# Patient Record
Sex: Female | Born: 1985 | Race: White | Hispanic: Yes | Marital: Single | Smoking: Never smoker
Health system: Southern US, Community
[De-identification: ages and names within clinical notes are randomized; demographics above are authoritative.]

## PROBLEM LIST (undated history)

## (undated) DIAGNOSIS — Z789 Other specified health status: Secondary | ICD-10-CM

## (undated) HISTORY — PX: NO PAST SURGERIES: SHX2092

---

## 2009-06-05 ENCOUNTER — Emergency Department (HOSPITAL_COMMUNITY): Admission: EM | Admit: 2009-06-05 | Discharge: 2009-06-05 | Payer: Self-pay | Admitting: Emergency Medicine

## 2009-06-09 ENCOUNTER — Inpatient Hospital Stay (HOSPITAL_COMMUNITY): Admission: AD | Admit: 2009-06-09 | Discharge: 2009-06-09 | Payer: Self-pay | Admitting: Obstetrics and Gynecology

## 2009-09-05 ENCOUNTER — Ambulatory Visit (HOSPITAL_COMMUNITY): Admission: RE | Admit: 2009-09-05 | Discharge: 2009-09-05 | Payer: Self-pay | Admitting: Family Medicine

## 2009-09-25 ENCOUNTER — Ambulatory Visit (HOSPITAL_COMMUNITY): Admission: RE | Admit: 2009-09-25 | Discharge: 2009-09-25 | Payer: Self-pay | Admitting: Obstetrics & Gynecology

## 2009-11-06 ENCOUNTER — Ambulatory Visit (HOSPITAL_COMMUNITY): Admission: RE | Admit: 2009-11-06 | Discharge: 2009-11-06 | Payer: Self-pay | Admitting: Obstetrics & Gynecology

## 2009-12-11 ENCOUNTER — Ambulatory Visit (HOSPITAL_COMMUNITY): Admission: RE | Admit: 2009-12-11 | Discharge: 2009-12-11 | Payer: Self-pay | Admitting: Obstetrics & Gynecology

## 2010-01-14 ENCOUNTER — Inpatient Hospital Stay (HOSPITAL_COMMUNITY): Admission: AD | Admit: 2010-01-14 | Discharge: 2010-01-14 | Payer: Self-pay | Admitting: Obstetrics & Gynecology

## 2010-01-19 ENCOUNTER — Inpatient Hospital Stay (HOSPITAL_COMMUNITY): Admission: AD | Admit: 2010-01-19 | Discharge: 2010-01-21 | Payer: Self-pay | Admitting: Obstetrics & Gynecology

## 2010-01-19 ENCOUNTER — Ambulatory Visit: Payer: Self-pay | Admitting: Obstetrics and Gynecology

## 2010-01-19 ENCOUNTER — Inpatient Hospital Stay (HOSPITAL_COMMUNITY): Admission: AD | Admit: 2010-01-19 | Discharge: 2010-01-19 | Payer: Self-pay | Admitting: Obstetrics and Gynecology

## 2010-01-23 ENCOUNTER — Inpatient Hospital Stay (HOSPITAL_COMMUNITY)
Admission: AD | Admit: 2010-01-23 | Discharge: 2010-01-26 | Payer: Self-pay | Source: Home / Self Care | Admitting: Obstetrics and Gynecology

## 2010-03-05 ENCOUNTER — Encounter: Admission: RE | Admit: 2010-03-05 | Discharge: 2010-03-05 | Payer: Self-pay | Admitting: Family Medicine

## 2010-08-14 LAB — DIFFERENTIAL
Basophils Absolute: 0 10*3/uL (ref 0.0–0.1)
Basophils Relative: 0 % (ref 0–1)
Basophils Relative: 0 % (ref 0–1)
Eosinophils Absolute: 0.1 10*3/uL (ref 0.0–0.7)
Eosinophils Absolute: 0.1 10*3/uL (ref 0.0–0.7)
Eosinophils Relative: 1 % (ref 0–5)
Lymphs Abs: 1.9 10*3/uL (ref 0.7–4.0)
Monocytes Absolute: 0.5 10*3/uL (ref 0.1–1.0)
Monocytes Relative: 5 % (ref 3–12)
Neutro Abs: 8 10*3/uL — ABNORMAL HIGH (ref 1.7–7.7)
Neutrophils Relative %: 79 % — ABNORMAL HIGH (ref 43–77)

## 2010-08-14 LAB — LIPASE, BLOOD: Lipase: 22 U/L (ref 11–59)

## 2010-08-14 LAB — CBC
HCT: 31.6 % — ABNORMAL LOW (ref 36.0–46.0)
HCT: 32.6 % — ABNORMAL LOW (ref 36.0–46.0)
Hemoglobin: 10.4 g/dL — ABNORMAL LOW (ref 12.0–15.0)
Hemoglobin: 10.6 g/dL — ABNORMAL LOW (ref 12.0–15.0)
Hemoglobin: 10.9 g/dL — ABNORMAL LOW (ref 12.0–15.0)
MCH: 26.7 pg (ref 26.0–34.0)
MCH: 26.9 pg (ref 26.0–34.0)
MCHC: 32.3 g/dL (ref 30.0–36.0)
MCHC: 32.9 g/dL (ref 30.0–36.0)
MCV: 82.4 fL (ref 78.0–100.0)
MCV: 82.6 fL (ref 78.0–100.0)
Platelets: 167 10*3/uL (ref 150–400)
RBC: 3.84 MIL/uL — ABNORMAL LOW (ref 3.87–5.11)
RDW: 16.2 % — ABNORMAL HIGH (ref 11.5–15.5)
RDW: 16.8 % — ABNORMAL HIGH (ref 11.5–15.5)
WBC: 10.9 10*3/uL — ABNORMAL HIGH (ref 4.0–10.5)
WBC: 9.6 10*3/uL (ref 4.0–10.5)

## 2010-08-14 LAB — MRSA CULTURE

## 2010-08-14 LAB — COMPREHENSIVE METABOLIC PANEL
ALT: 27 U/L (ref 0–35)
AST: 23 U/L (ref 0–37)
Alkaline Phosphatase: 96 U/L (ref 39–117)
CO2: 25 mEq/L (ref 19–32)
CO2: 26 mEq/L (ref 19–32)
Calcium: 8.1 mg/dL — ABNORMAL LOW (ref 8.4–10.5)
Calcium: 8.8 mg/dL (ref 8.4–10.5)
Chloride: 106 mEq/L (ref 96–112)
Creatinine, Ser: 0.48 mg/dL (ref 0.4–1.2)
GFR calc Af Amer: >60 mL/min (ref >60)
GFR calc Af Amer: >60 mL/min (ref >60)
GFR calc non Af Amer: >60 mL/min (ref >60)
GFR calc non Af Amer: >60 mL/min (ref >60)
Glucose, Bld: 97 mg/dL (ref 70–99)
Glucose, Bld: 98 mg/dL (ref 70–99)
Potassium: 3.2 mEq/L — ABNORMAL LOW (ref 3.5–5.1)
Sodium: 138 mEq/L (ref 135–145)
Sodium: 139 mEq/L (ref 135–145)
Total Bilirubin: 0.6 mg/dL (ref 0.3–1.2)
Total Protein: 6.1 g/dL (ref 6.0–8.3)

## 2010-08-14 LAB — URINALYSIS, DIPSTICK ONLY
Bilirubin Urine: NEGATIVE
Specific Gravity, Urine: 1.015 (ref 1.005–1.030)
Urobilinogen, UA: 0.2 mg/dL (ref 0.0–1.0)

## 2010-08-14 LAB — URINE CULTURE
Colony Count: 25000
Culture  Setup Time: 201108271809

## 2010-08-14 LAB — RPR: RPR Ser Ql: NONREACTIVE

## 2010-08-16 LAB — COMPREHENSIVE METABOLIC PANEL
Albumin: 3.4 g/dL — ABNORMAL LOW (ref 3.5–5.2)
BUN: 6 mg/dL (ref 6–23)
Chloride: 100 mEq/L (ref 96–112)
Creatinine, Ser: 0.41 mg/dL (ref 0.4–1.2)
GFR calc non Af Amer: >60 mL/min (ref >60)
Glucose, Bld: 77 mg/dL (ref 70–99)
Total Bilirubin: 0.2 mg/dL — ABNORMAL LOW (ref 0.3–1.2)

## 2010-08-16 LAB — URINALYSIS, ROUTINE W REFLEX MICROSCOPIC
Glucose, UA: 100 mg/dL — AB
Glucose, UA: NEGATIVE mg/dL
Hgb urine dipstick: NEGATIVE
Hgb urine dipstick: NEGATIVE
Ketones, ur: NEGATIVE mg/dL
Leukocytes, UA: NEGATIVE
Protein, ur: NEGATIVE mg/dL
Protein, ur: NEGATIVE mg/dL
Specific Gravity, Urine: 1.025 (ref 1.005–1.030)
pH: 7.5 (ref 5.0–8.0)

## 2010-08-16 LAB — CBC
HCT: 37.3 % (ref 36.0–46.0)
MCV: 84.3 fL (ref 78.0–100.0)
Platelets: 213 10*3/uL (ref 150–400)
RDW: 14.6 % (ref 11.5–15.5)
WBC: 12.4 10*3/uL — ABNORMAL HIGH (ref 4.0–10.5)

## 2010-08-16 LAB — URINE MICROSCOPIC-ADD ON

## 2010-08-16 LAB — POCT PREGNANCY, URINE: Preg Test, Ur: POSITIVE

## 2011-02-25 IMAGING — US US PELVIS COMPLETE
1 series · 14 of 25 positions shown · non-contrast
Comparison: None.

CLINICAL DATA: Pelvic pain.

OBSTETRIC <14 WK US AND TRANSVAGINAL OB US
TECHNIQUE: Both transabdominal and transvaginal ultrasound
examinations were performed for complete evaluation of the
gestation as well as the maternal uterus, adnexal regions, and
pelvic cul-de-sac.

[Series 1: us pelvis complete · 0.23mm/px · 14 of 63 slices shown]
[im 1/63]
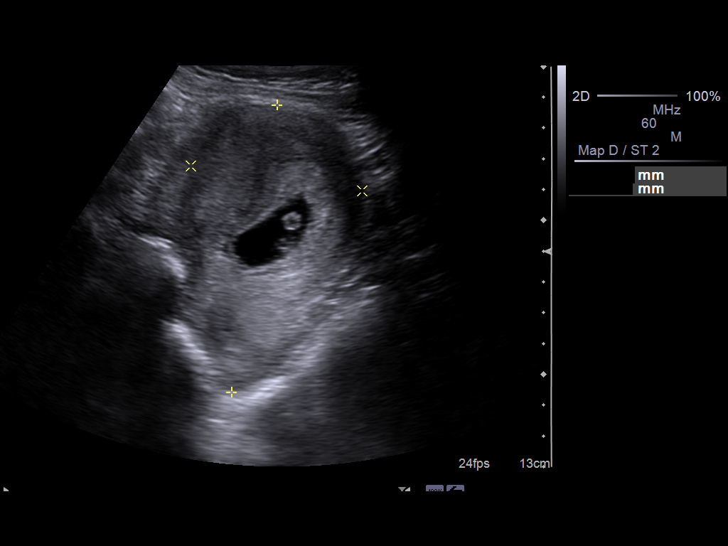
[im 6/63]
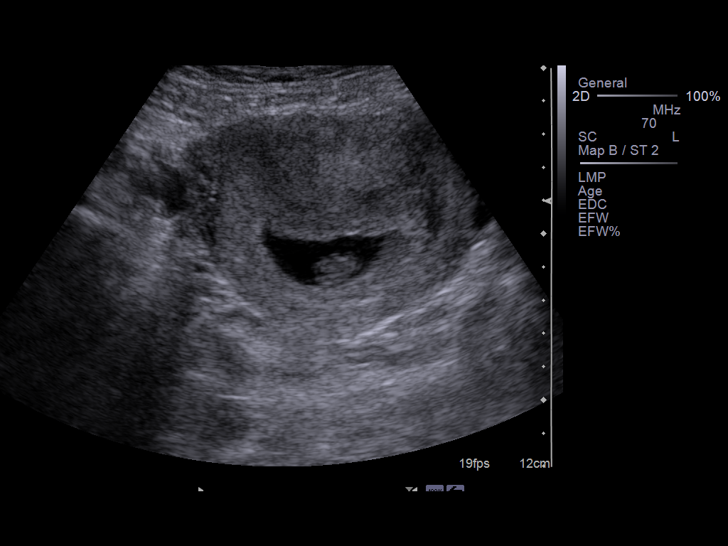
[im 11/63]
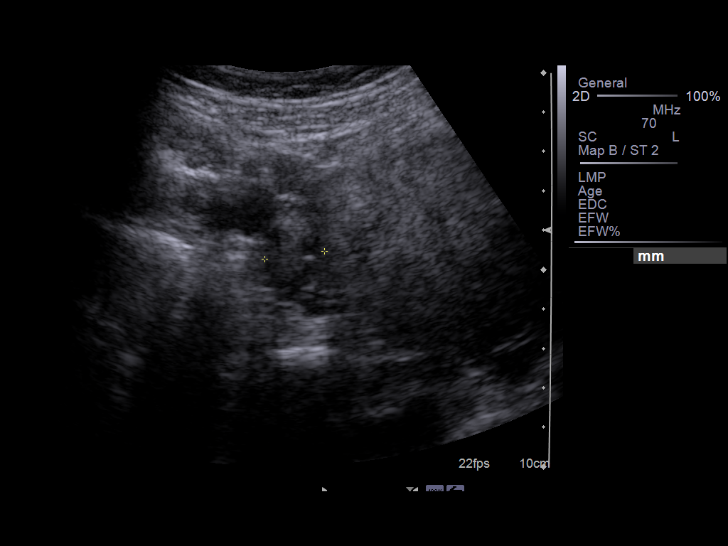
[im 16/63]
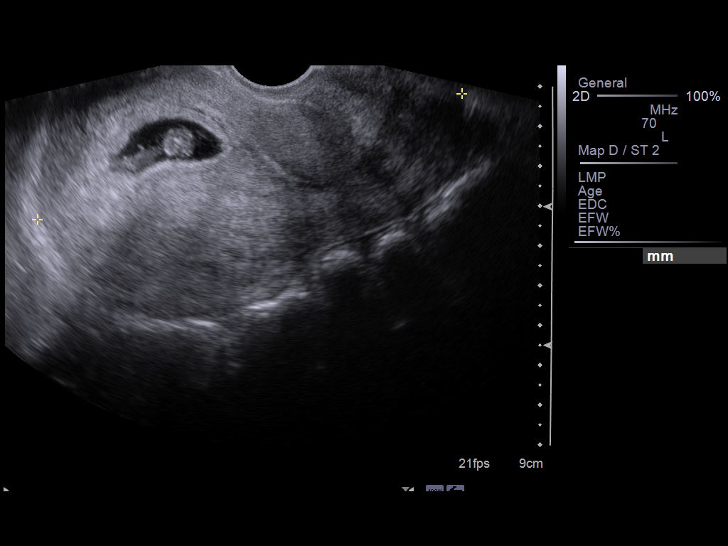
[im 21/63]
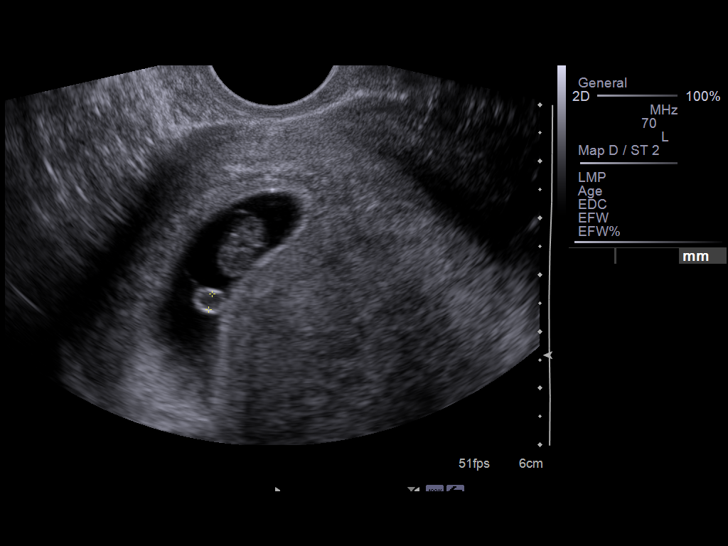
[im 24/63]
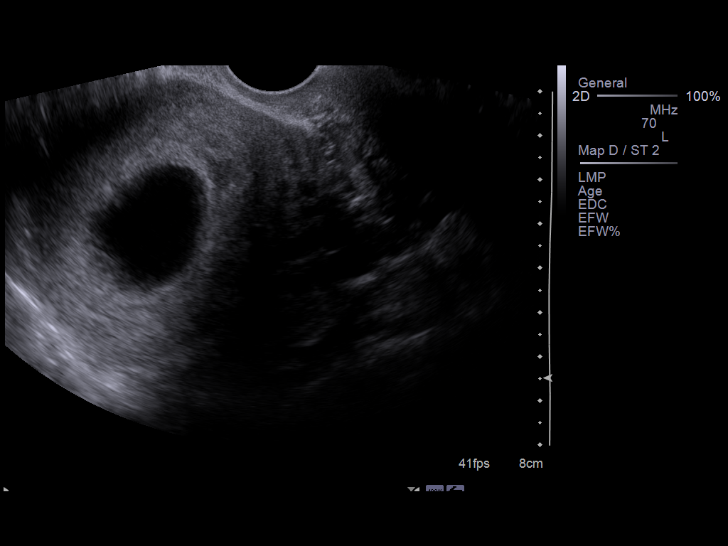
[im 29/63]
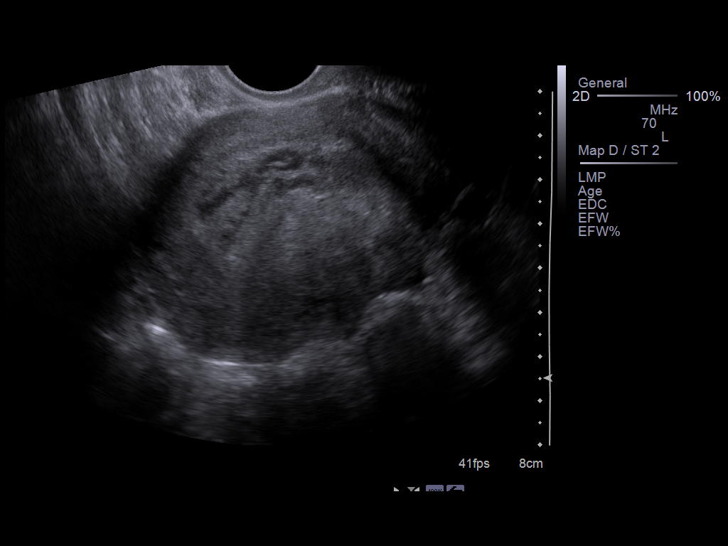
[im 34/63]
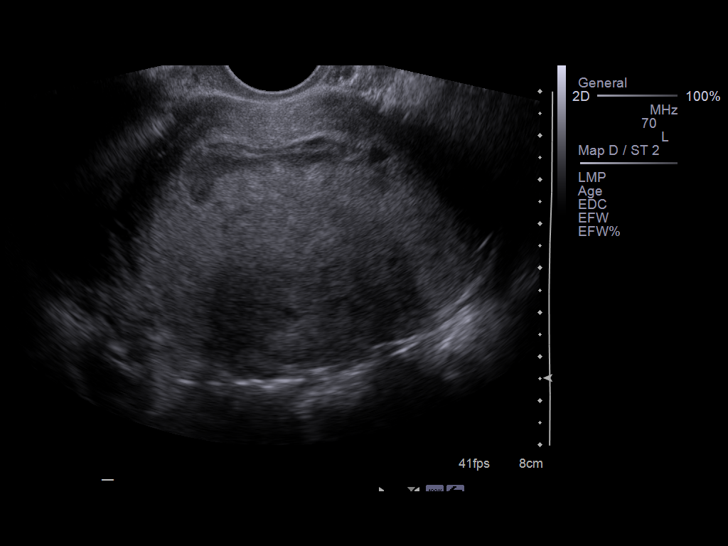
[im 39/63]
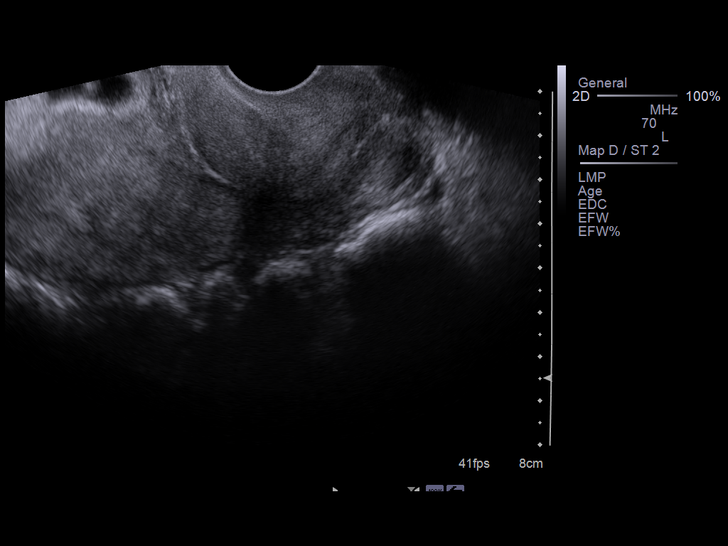
[im 42/63]
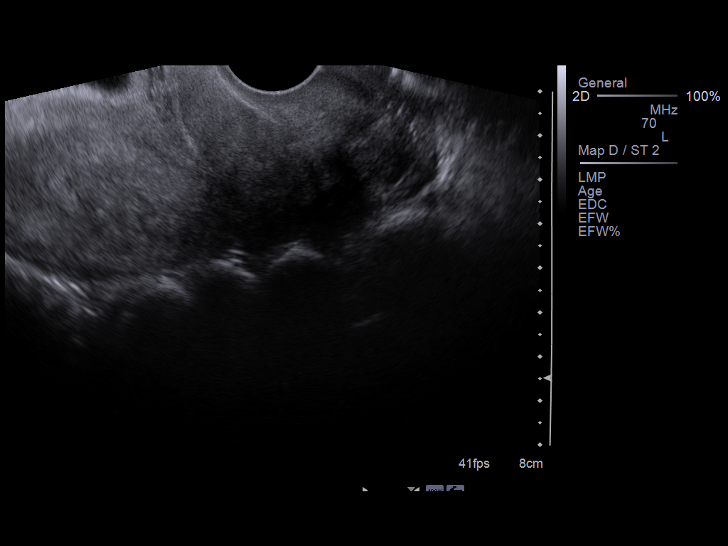
[im 47/63]
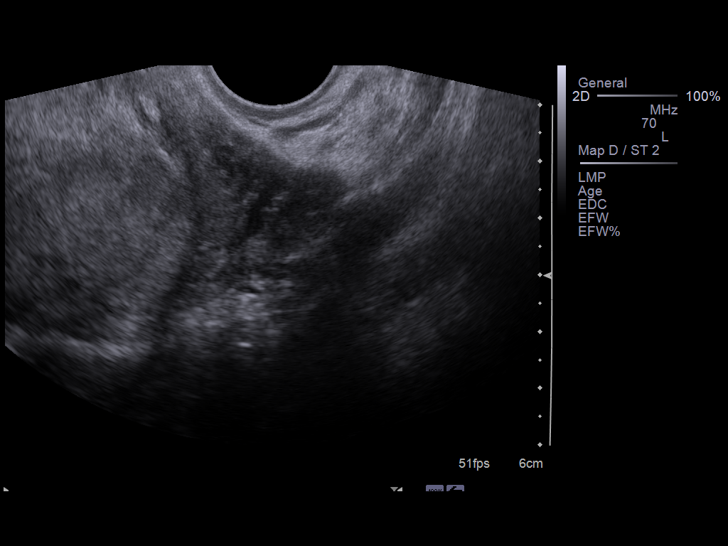
[im 52/63]
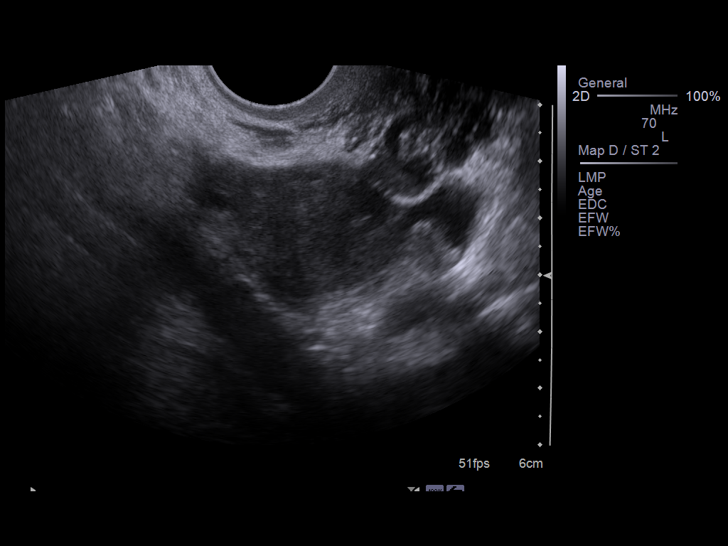
[im 57/63]
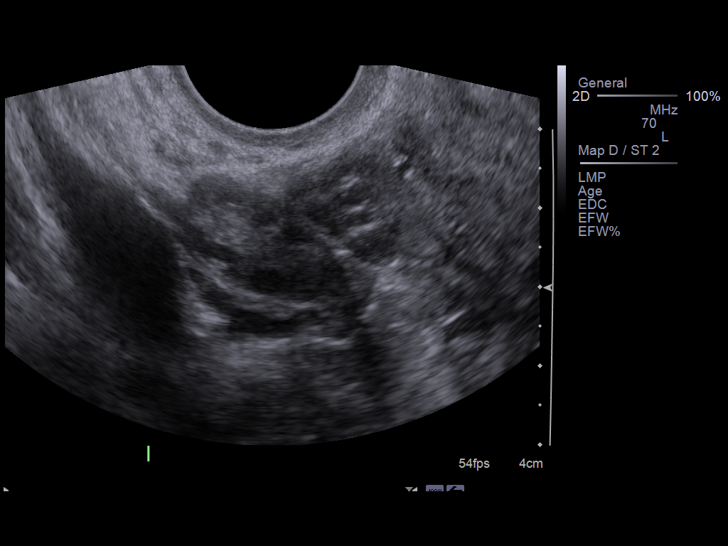
[im 63/63]
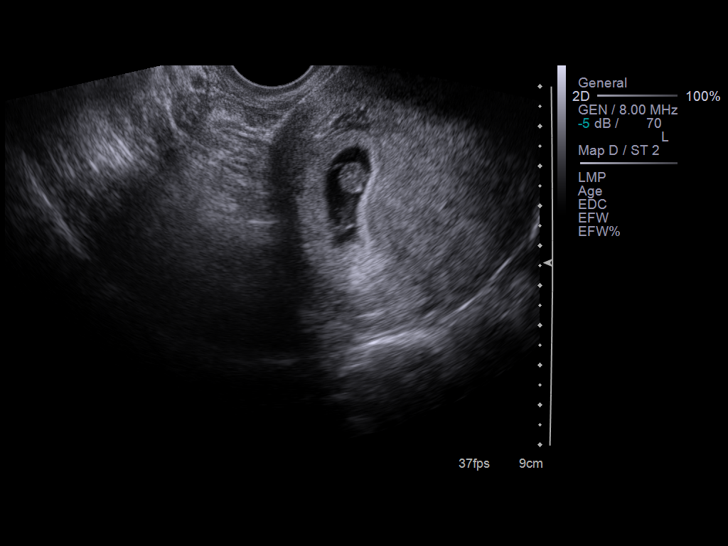

[14 of 25 positions shown; findings below may reference images not displayed]

Intrauterine gestational sac: Single
Yolk sac: Yes
Embryo: Yes
Cardiac Activity: Yes
Heart Rate: 160 bpm

MSD: 35  mm  8    w 6    d
CRL: 11.8 mm           7   w  2   d             US EDC: 01/15/2010

Maternal uterus/adnexae:
There is a small subchorionic hemorrhage.

Both ovaries are normal with evidence of blood flow to both
ovaries.

There is no free fluid in the pelvis.
IMPRESSION: Small subchorionic hemorrhage.

## 2011-07-29 IMAGING — US US OB FOLLOW-UP
1 series · 18 of 28 positions shown · non-contrast
Comparison: none

OBSTETRICAL ULTRASOUND:
 This ultrasound was performed in The [HOSPITAL], and the [facility] report will be stored to [REDACTED] PACS.  This report is also available in [HOSPITAL]?s accessANYware.

[Series 1: us ob follow-up · 18 of 63 slices shown]
[im 1/63]
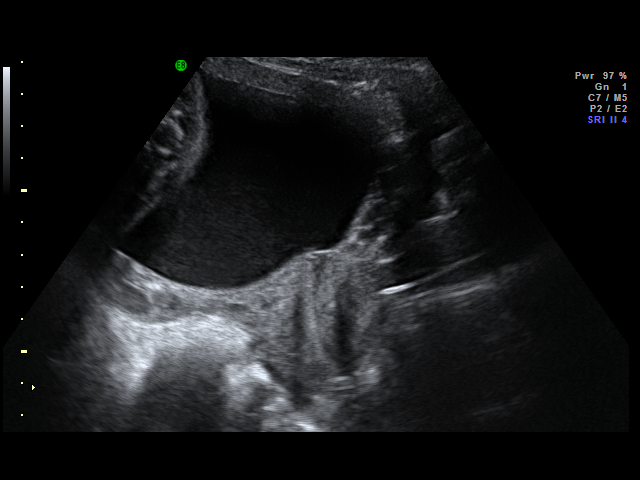
[im 5/63]
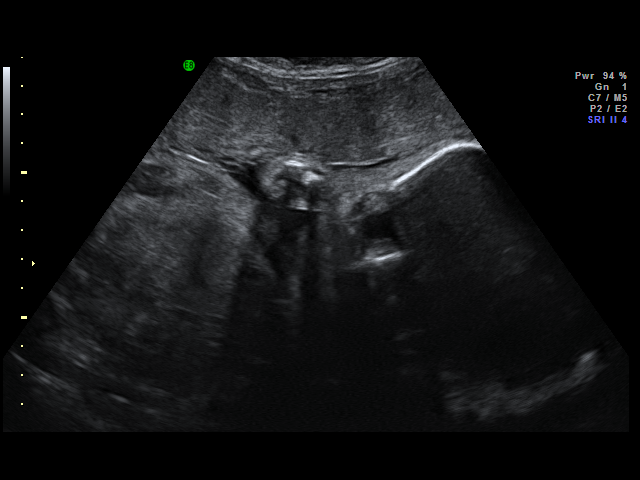
[im 7/63]
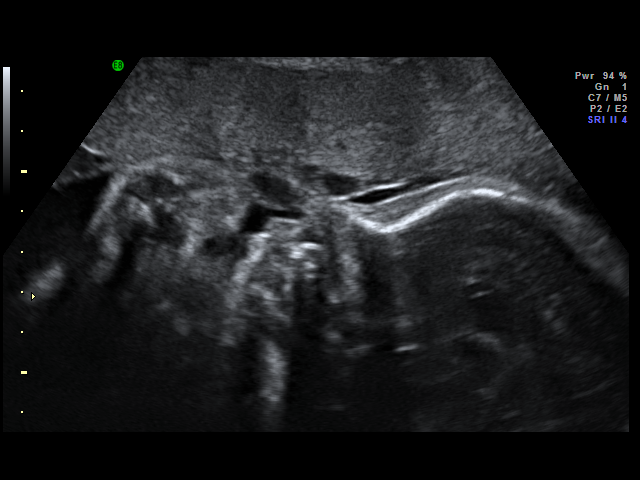
[im 12/63]
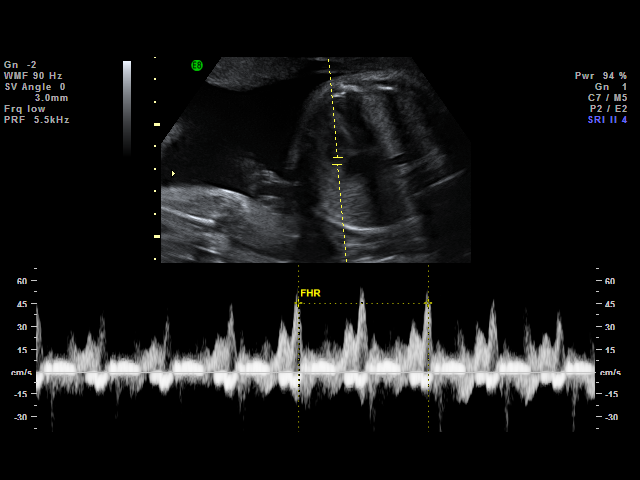
[im 17/63]
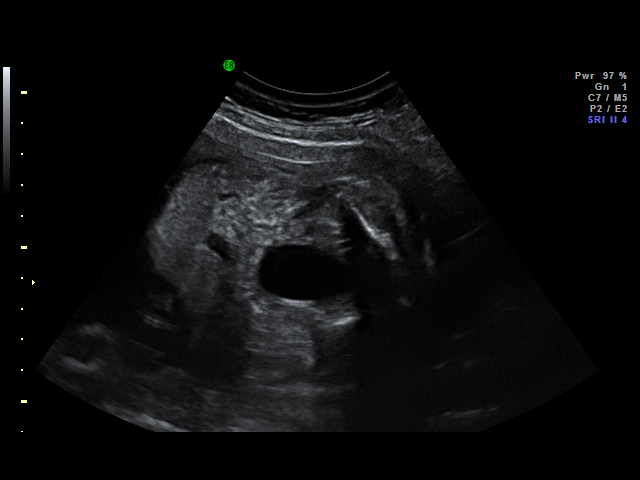
[im 19/63]
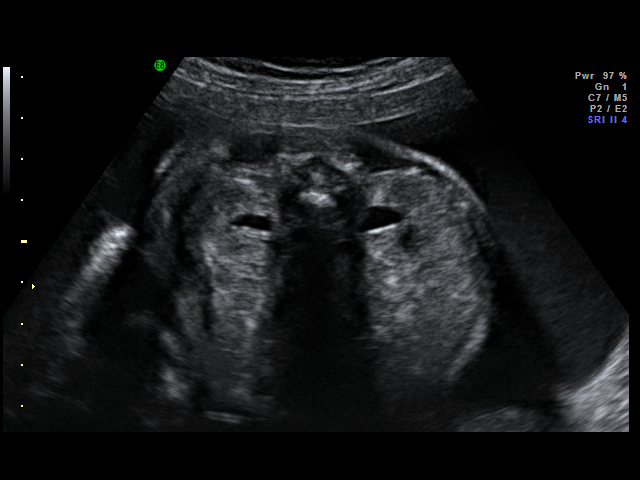
[im 23/63]
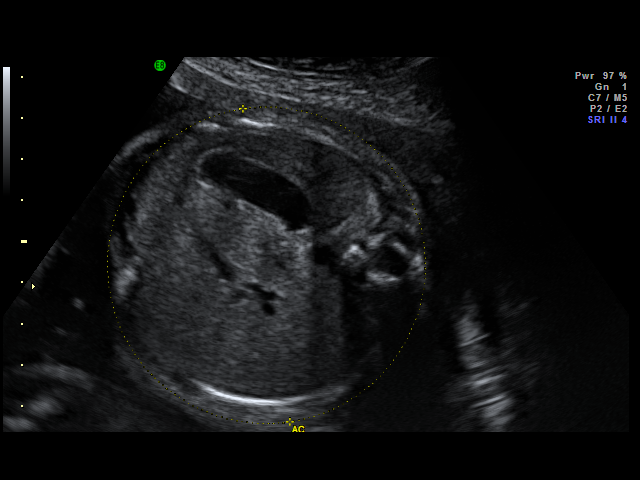
[im 26/63]
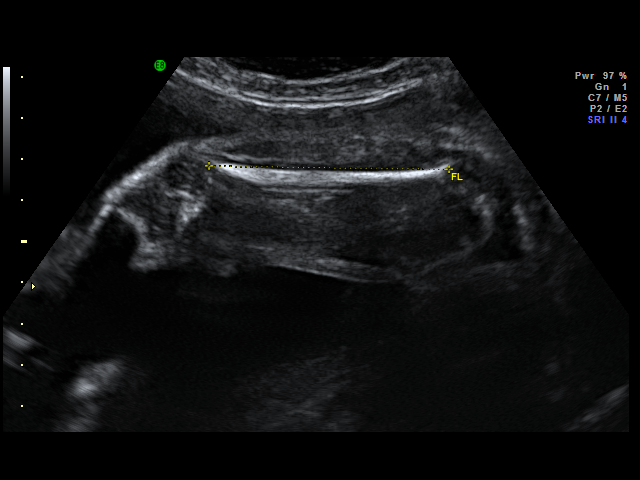
[im 30/63]
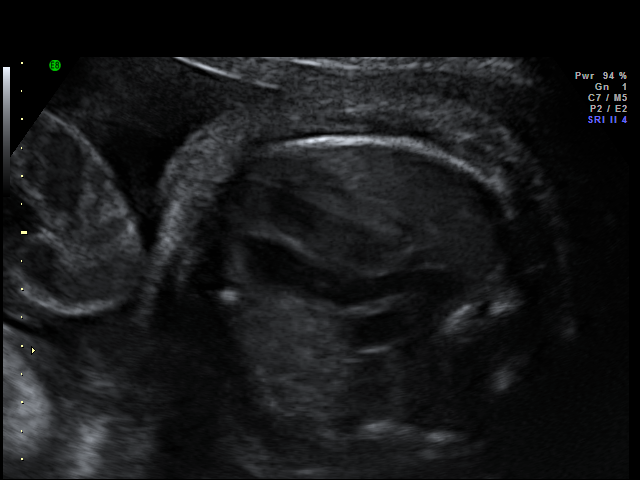
[im 33/63]
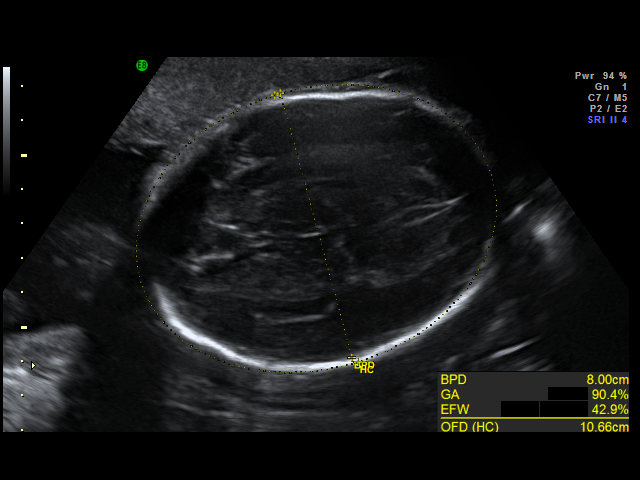
[im 37/63]
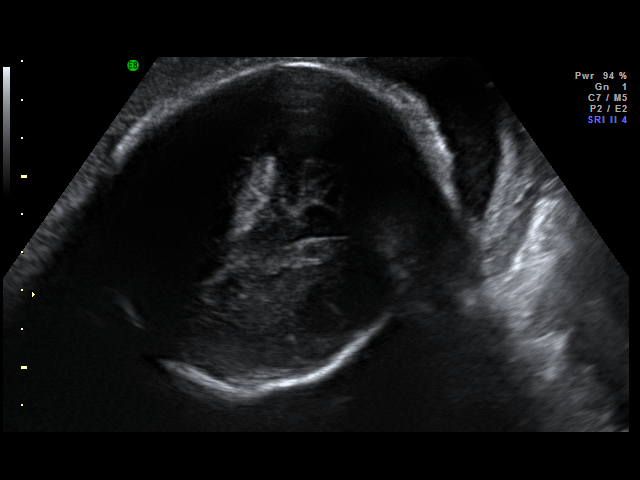
[im 40/63]
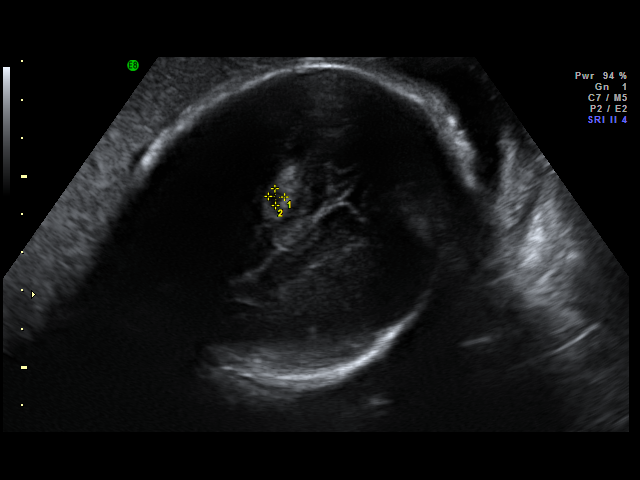
[im 44/63]
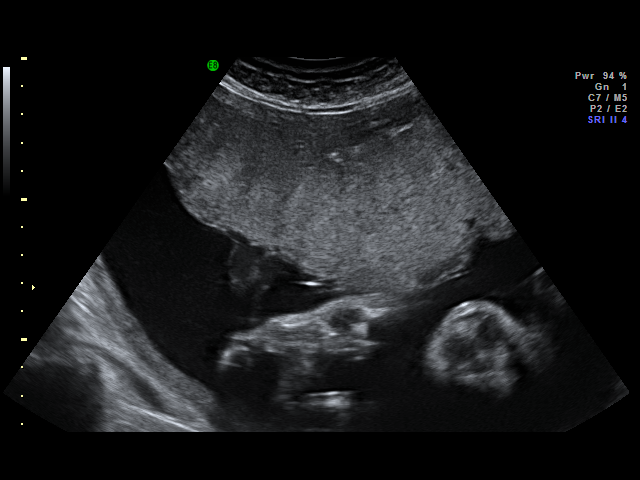
[im 49/63]
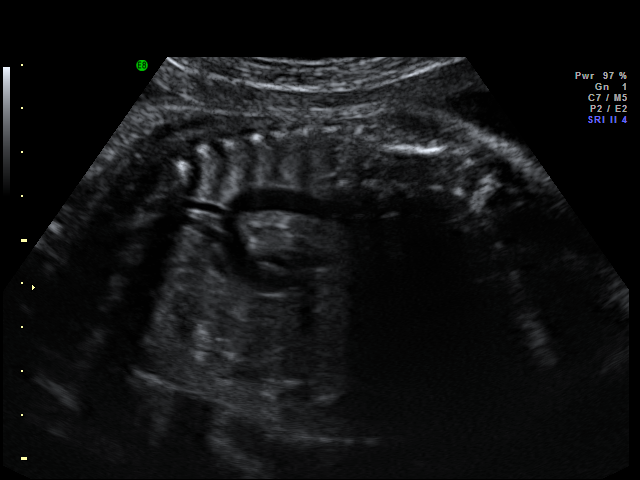
[im 51/63]
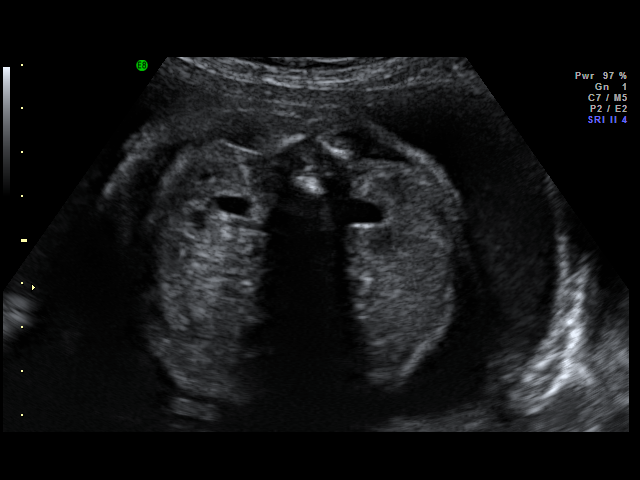
[im 56/63]
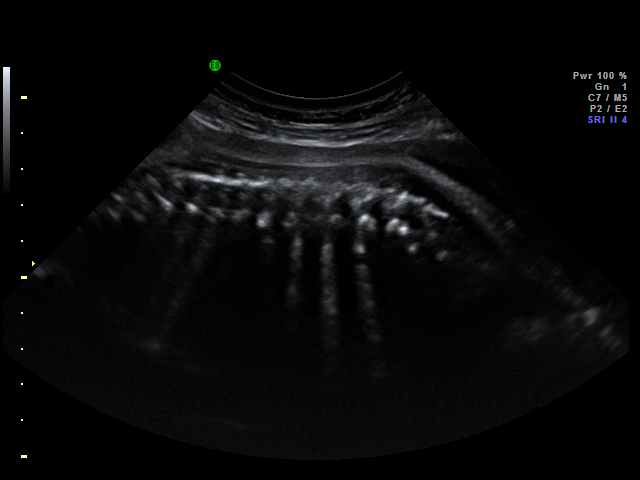
[im 58/63]
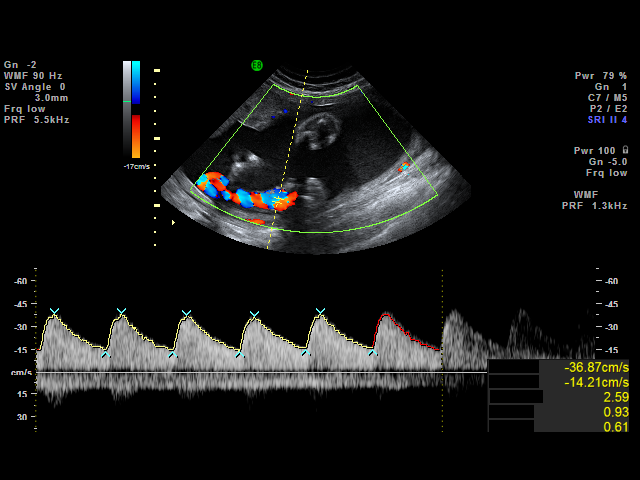
[im 63/63]
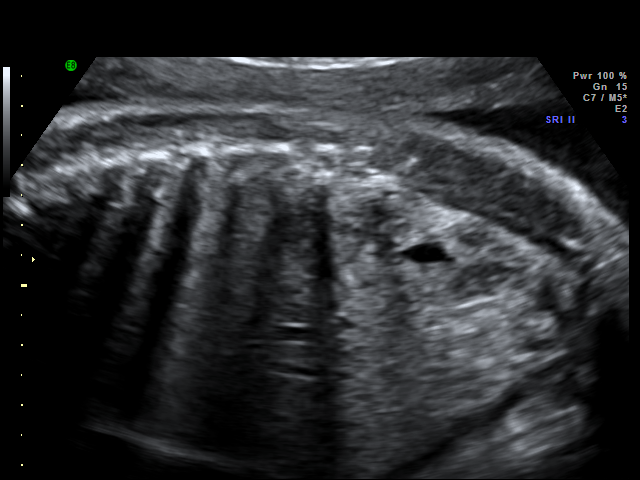

[18 of 28 positions shown; findings below may reference images not displayed]

IMPRESSION: [facility] has also been faxed to the ordering physician.

## 2017-05-31 NOTE — L&D Delivery Note (Signed)
OB/GYN Faculty Practice Delivery Note  Heather Gentry is a 32 y.o. G2P1001 s/p NSVD at 3169w4d. She was admitted for labor.   ROM: 4h 622m with bloody fluid GBS Status: neg Maximum Maternal Temperature: 99.5  Labor Progress: . Presented in early labor with some vaginal bleeding, progressed without augmentation.  Delivery Date/Time: 04/14/18 at 1453 Delivery: Called to room and patient was complete and pushing. Head delivered LOA. Cord wrapped around ankles Shoulder and body delivered in usual fashion. Infant with spontaneous cry, placed on mother's abdomen, dried and stimulated. Cord clamped x 2 after 1-minute delay, and cut by dad. Cord blood drawn. Placenta delivered spontaneously with gentle cord traction. Fundus firm with massage and Pitocin. Labia, perineum, vagina, and cervix inspected with bilateral periurethral tears, right hemostatic, left repaired with one 3-0 vicryl suture. Mild abrasion at perineum, hemostatic.  Placenta: intact Complications: none Lacerations: bilateral periurethral, repaired left EBL: 309 ml  Postpartum Planning [/] message to sent to schedule follow-up  [x]  vaccines UTD  Recommend f/u HBsAg testing and referral to hepatology pending results  Infant: Heather Gentry  APGARs 9/9  weight pending  Diego Delancey L. Zachery ConchFriedman, MD OB/GYN Fellow, Faculty Practice

## 2017-10-03 LAB — OB RESULTS CONSOLE RUBELLA ANTIBODY, IGM: Rubella: IMMUNE

## 2017-10-03 LAB — OB RESULTS CONSOLE HEPATITIS B SURFACE ANTIGEN: Hepatitis B Surface Ag: POSITIVE

## 2017-10-03 LAB — OB RESULTS CONSOLE HIV ANTIBODY (ROUTINE TESTING): HIV: NONREACTIVE

## 2017-10-03 LAB — OB RESULTS CONSOLE ANTIBODY SCREEN: Antibody Screen: NEGATIVE

## 2017-10-03 LAB — OB RESULTS CONSOLE GC/CHLAMYDIA
CHLAMYDIA, DNA PROBE: NEGATIVE
Gonorrhea: NEGATIVE

## 2017-10-03 LAB — OB RESULTS CONSOLE RPR: RPR: NONREACTIVE

## 2017-10-03 LAB — OB RESULTS CONSOLE ABO/RH: RH TYPE: POSITIVE

## 2017-10-04 ENCOUNTER — Other Ambulatory Visit (HOSPITAL_COMMUNITY): Payer: Self-pay | Admitting: Family

## 2017-10-04 DIAGNOSIS — Z369 Encounter for antenatal screening, unspecified: Secondary | ICD-10-CM

## 2017-10-04 DIAGNOSIS — Z3A13 13 weeks gestation of pregnancy: Secondary | ICD-10-CM

## 2017-10-12 ENCOUNTER — Ambulatory Visit (HOSPITAL_COMMUNITY): Payer: Self-pay

## 2017-10-12 ENCOUNTER — Encounter (HOSPITAL_COMMUNITY): Payer: Self-pay

## 2018-03-20 LAB — OB RESULTS CONSOLE GBS: STREP GROUP B AG: NEGATIVE

## 2018-03-20 LAB — OB RESULTS CONSOLE GC/CHLAMYDIA
Chlamydia: NEGATIVE
Gonorrhea: NEGATIVE

## 2018-04-12 ENCOUNTER — Encounter (HOSPITAL_COMMUNITY): Payer: Self-pay | Admitting: *Deleted

## 2018-04-12 ENCOUNTER — Telehealth (HOSPITAL_COMMUNITY): Payer: Self-pay | Admitting: *Deleted

## 2018-04-12 NOTE — Telephone Encounter (Signed)
Preadmission screen  

## 2018-04-14 ENCOUNTER — Other Ambulatory Visit: Payer: Self-pay

## 2018-04-14 ENCOUNTER — Inpatient Hospital Stay (HOSPITAL_COMMUNITY): Payer: Medicaid Other | Admitting: Anesthesiology

## 2018-04-14 ENCOUNTER — Inpatient Hospital Stay (HOSPITAL_COMMUNITY)
Admission: AD | Admit: 2018-04-14 | Discharge: 2018-04-16 | DRG: 806 | Disposition: A | Discharge status: Home / Self Care | Payer: Medicaid Other | Attending: Obstetrics & Gynecology | Admitting: Obstetrics & Gynecology

## 2018-04-14 ENCOUNTER — Encounter (HOSPITAL_COMMUNITY): Payer: Self-pay | Admitting: *Deleted

## 2018-04-14 DIAGNOSIS — B1910 Unspecified viral hepatitis B without hepatic coma: Secondary | ICD-10-CM | POA: Diagnosis present

## 2018-04-14 DIAGNOSIS — Z3A40 40 weeks gestation of pregnancy: Secondary | ICD-10-CM

## 2018-04-14 DIAGNOSIS — O9842 Viral hepatitis complicating childbirth: Secondary | ICD-10-CM | POA: Diagnosis present

## 2018-04-14 DIAGNOSIS — O9902 Anemia complicating childbirth: Secondary | ICD-10-CM | POA: Diagnosis present

## 2018-04-14 DIAGNOSIS — Z3483 Encounter for supervision of other normal pregnancy, third trimester: Secondary | ICD-10-CM | POA: Diagnosis present

## 2018-04-14 DIAGNOSIS — D649 Anemia, unspecified: Secondary | ICD-10-CM | POA: Diagnosis present

## 2018-04-14 DIAGNOSIS — O98419 Viral hepatitis complicating pregnancy, unspecified trimester: Secondary | ICD-10-CM | POA: Diagnosis present

## 2018-04-14 DIAGNOSIS — O480 Post-term pregnancy: Secondary | ICD-10-CM | POA: Diagnosis not present

## 2018-04-14 HISTORY — DX: Other specified health status: Z78.9

## 2018-04-14 LAB — CBC
HEMATOCRIT: 37.7 % (ref 36.0–46.0)
HEMOGLOBIN: 12.4 g/dL (ref 12.0–15.0)
MCH: 29.1 pg (ref 26.0–34.0)
MCHC: 32.9 g/dL (ref 30.0–36.0)
MCV: 88.5 fL (ref 80.0–100.0)
NRBC: 0 % (ref 0.0–0.2)
Platelets: 163 10*3/uL (ref 150–400)
RBC: 4.26 MIL/uL (ref 3.87–5.11)
RDW: 14.2 % (ref 11.5–15.5)
WBC: 17.1 10*3/uL — ABNORMAL HIGH (ref 4.0–10.5)

## 2018-04-14 LAB — TYPE AND SCREEN
ABO/RH(D): O POS
Antibody Screen: NEGATIVE

## 2018-04-14 LAB — HEPATIC FUNCTION PANEL
ALK PHOS: 143 U/L — AB (ref 38–126)
ALT: 25 U/L (ref 0–44)
AST: 22 U/L (ref 15–41)
Albumin: 3.2 g/dL — ABNORMAL LOW (ref 3.5–5.0)
BILIRUBIN INDIRECT: 0.5 mg/dL (ref 0.3–0.9)
Bilirubin, Direct: 0.1 mg/dL (ref 0.0–0.2)
Total Bilirubin: 0.6 mg/dL (ref 0.3–1.2)
Total Protein: 6.5 g/dL (ref 6.5–8.1)

## 2018-04-14 LAB — ABO/RH: ABO/RH(D): O POS

## 2018-04-14 MED ORDER — OXYTOCIN 40 UNITS IN LACTATED RINGERS INFUSION - SIMPLE MED
2.5000 [IU]/h | INTRAVENOUS | Status: DC
  Filled 2018-04-14: qty 1000

## 2018-04-14 MED ORDER — DIPHENHYDRAMINE HCL 25 MG PO CAPS: 25.0000 mg | ORAL_CAPSULE | Freq: Four times a day (QID) | ORAL | Status: DC | PRN

## 2018-04-14 MED ORDER — LACTATED RINGERS IV SOLN: 500.0000 mL | Freq: Once | INTRAVENOUS | Status: DC

## 2018-04-14 MED ORDER — ACETAMINOPHEN 325 MG PO TABS: 650.0000 mg | ORAL_TABLET | ORAL | Status: DC | PRN

## 2018-04-14 MED ORDER — ZOLPIDEM TARTRATE 5 MG PO TABS: 5.0000 mg | ORAL_TABLET | Freq: Every evening | ORAL | Status: DC | PRN

## 2018-04-14 MED ORDER — WITCH HAZEL-GLYCERIN EX PADS: 1.0000 "application " | MEDICATED_PAD | CUTANEOUS | Status: DC | PRN

## 2018-04-14 MED ORDER — ONDANSETRON HCL 4 MG PO TABS: 4.0000 mg | ORAL_TABLET | ORAL | Status: DC | PRN

## 2018-04-14 MED ORDER — ONDANSETRON HCL 4 MG/2ML IJ SOLN: 4.0000 mg | INTRAMUSCULAR | Status: DC | PRN

## 2018-04-14 MED ORDER — FLEET ENEMA 7-19 GM/118ML RE ENEM: 1.0000 | ENEMA | RECTAL | Status: DC | PRN

## 2018-04-14 MED ORDER — SENNOSIDES-DOCUSATE SODIUM 8.6-50 MG PO TABS
2.0000 | ORAL_TABLET | ORAL | Status: DC
  Administered 2018-04-14: 2 via ORAL
  Filled 2018-04-14: qty 2

## 2018-04-14 MED ORDER — OXYTOCIN BOLUS FROM INFUSION
500.0000 mL | Freq: Once | INTRAVENOUS | Status: AC
  Administered 2018-04-14: 500 mL via INTRAVENOUS

## 2018-04-14 MED ORDER — DIPHENHYDRAMINE HCL 50 MG/ML IJ SOLN: 12.5000 mg | INTRAMUSCULAR | Status: DC | PRN

## 2018-04-14 MED ORDER — BENZOCAINE-MENTHOL 20-0.5 % EX AERO
1.0000 "application " | INHALATION_SPRAY | CUTANEOUS | Status: DC | PRN
  Administered 2018-04-14: 1 via TOPICAL
  Filled 2018-04-14: qty 56

## 2018-04-14 MED ORDER — PRENATAL MULTIVITAMIN CH
1.0000 | ORAL_TABLET | Freq: Every day | ORAL | Status: DC
  Administered 2018-04-15 – 2018-04-16 (×2): 1 via ORAL
  Filled 2018-04-14 (×2): qty 1

## 2018-04-14 MED ORDER — OXYCODONE-ACETAMINOPHEN 5-325 MG PO TABS: 1.0000 | ORAL_TABLET | ORAL | Status: DC | PRN

## 2018-04-14 MED ORDER — FENTANYL CITRATE (PF) 100 MCG/2ML IJ SOLN: 100.0000 ug | INTRAMUSCULAR | Status: DC | PRN

## 2018-04-14 MED ORDER — PHENYLEPHRINE 40 MCG/ML (10ML) SYRINGE FOR IV PUSH (FOR BLOOD PRESSURE SUPPORT)
80.0000 ug | PREFILLED_SYRINGE | INTRAVENOUS | Status: DC | PRN
  Filled 2018-04-14: qty 5

## 2018-04-14 MED ORDER — COCONUT OIL OIL: 1.0000 "application " | TOPICAL_OIL | Status: DC | PRN

## 2018-04-14 MED ORDER — DIBUCAINE 1 % RE OINT: 1.0000 "application " | TOPICAL_OINTMENT | RECTAL | Status: DC | PRN

## 2018-04-14 MED ORDER — EPHEDRINE 5 MG/ML INJ
10.0000 mg | INTRAVENOUS | Status: DC | PRN
  Administered 2018-04-14: 10 mg via INTRAVENOUS
  Filled 2018-04-14: qty 2

## 2018-04-14 MED ORDER — LIDOCAINE HCL (PF) 1 % IJ SOLN
30.0000 mL | INTRAMUSCULAR | Status: DC | PRN
  Filled 2018-04-14: qty 30

## 2018-04-14 MED ORDER — OXYCODONE-ACETAMINOPHEN 5-325 MG PO TABS: 2.0000 | ORAL_TABLET | ORAL | Status: DC | PRN

## 2018-04-14 MED ORDER — IBUPROFEN 600 MG PO TABS
600.0000 mg | ORAL_TABLET | Freq: Four times a day (QID) | ORAL | Status: DC
  Administered 2018-04-14 – 2018-04-16 (×8): 600 mg via ORAL
  Filled 2018-04-14 (×8): qty 1

## 2018-04-14 MED ORDER — PHENYLEPHRINE 40 MCG/ML (10ML) SYRINGE FOR IV PUSH (FOR BLOOD PRESSURE SUPPORT)
80.0000 ug | PREFILLED_SYRINGE | INTRAVENOUS | Status: DC | PRN
  Filled 2018-04-14: qty 10
  Filled 2018-04-14: qty 5

## 2018-04-14 MED ORDER — SOD CITRATE-CITRIC ACID 500-334 MG/5ML PO SOLN
30.0000 mL | ORAL | Status: DC | PRN
  Administered 2018-04-14: 30 mL via ORAL
  Filled 2018-04-14: qty 15

## 2018-04-14 MED ORDER — SIMETHICONE 80 MG PO CHEW: 80.0000 mg | CHEWABLE_TABLET | ORAL | Status: DC | PRN

## 2018-04-14 MED ORDER — LACTATED RINGERS IV SOLN: 500.0000 mL | INTRAVENOUS | Status: DC | PRN

## 2018-04-14 MED ORDER — ONDANSETRON HCL 4 MG/2ML IJ SOLN: 4.0000 mg | Freq: Four times a day (QID) | INTRAMUSCULAR | Status: DC | PRN

## 2018-04-14 MED ORDER — LACTATED RINGERS IV SOLN
INTRAVENOUS | Status: DC
  Administered 2018-04-14 (×3): via INTRAVENOUS

## 2018-04-14 MED ORDER — EPHEDRINE 5 MG/ML INJ
10.0000 mg | INTRAVENOUS | Status: DC | PRN
  Filled 2018-04-14: qty 2

## 2018-04-14 MED ORDER — FENTANYL 2.5 MCG/ML BUPIVACAINE 1/10 % EPIDURAL INFUSION (WH - ANES)
14.0000 mL/h | INTRAMUSCULAR | Status: DC | PRN
  Filled 2018-04-14: qty 100

## 2018-04-14 MED ORDER — EPHEDRINE 5 MG/ML INJ
INTRAVENOUS | Status: AC
  Filled 2018-04-14: qty 4

## 2018-04-14 NOTE — MAU Note (Signed)
Pt presents to MAU c/o ctxs every few mins and pt is c/o blood when she uses the bathroom unsure if it is vaginal or bleeding in her urine. +FM. No LOF.

## 2018-04-14 NOTE — Progress Notes (Signed)
Labor Progress Note Heather Gentry is a 32 y.o. G2P1001 at 6992w4d presented for labor  S: note written for strip purposes only  O:  BP 111/61   Pulse 83   Temp 99.2 F (37.3 C) (Axillary)   Resp 16   Ht 5\' 4"  (1.626 m)   Wt 71.7 kg   SpO2 99%   BMI 27.12 kg/m  EFM: 150/mod var/+accels, intermittent variables  CVE: Dilation: 4.5 Effacement (%): 90 Cervical Position: Middle Station: -2, -1 Presentation: Vertex Exam by:: S Nix RN   A&P: 32 y.o. G2P1001 2292w4d here in labor. #Labor: Likely transitioning into active labor. S/p SROM. No indication for augmentation currently, will ctm.  #Pain: epidural #FWB: category II with intermittent variables, overall category I with moderate variability and accels #GBS negative  Lucile Hillmann L, MD 12:08 PM

## 2018-04-14 NOTE — Addendum Note (Signed)
Addendum  created 04/14/18 1751 by Elgie CongoMalinova, Cam Dauphin H, CRNA   Charge Capture section accepted, Sign clinical note

## 2018-04-14 NOTE — Anesthesia Procedure Notes (Signed)
Epidural Patient location during procedure: OB Start time: 04/14/2018 9:55 AM End time: 04/14/2018 10:00 AM  Staffing Anesthesiologist: Bethena Midgetddono, Kelani Robart, MD  Preanesthetic Checklist Completed: patient identified, site marked, surgical consent, pre-op evaluation, timeout performed, IV checked, risks and benefits discussed and monitors and equipment checked  Epidural Patient position: sitting Prep: site prepped and draped and DuraPrep Patient monitoring: continuous pulse ox and blood pressure Approach: midline Location: L3-L4 Injection technique: LOR air  Needle:  Needle type: Tuohy  Needle gauge: 17 G Needle length: 9 cm and 9 Needle insertion depth: 6 cm Catheter type: closed end flexible Catheter size: 19 Gauge Catheter at skin depth: 11 cm Test dose: negative  Assessment Events: blood not aspirated, injection not painful, no injection resistance, negative IV test and no paresthesia

## 2018-04-14 NOTE — Anesthesia Preprocedure Evaluation (Signed)
Anesthesia Evaluation  Patient identified by MRN, date of birth, ID band Patient awake    Reviewed: Allergy & Precautions, H&P , NPO status , Patient's Chart, lab work & pertinent test results, reviewed documented beta blocker date and time   Airway Mallampati: II  TM Distance: >3 FB Neck ROM: full    Dental no notable dental hx.    Pulmonary neg pulmonary ROS,    Pulmonary exam normal breath sounds clear to auscultation       Cardiovascular negative cardio ROS Normal cardiovascular exam Rhythm:regular Rate:Normal     Neuro/Psych negative neurological ROS  negative psych ROS   GI/Hepatic negative GI ROS, Neg liver ROS,   Endo/Other  negative endocrine ROS  Renal/GU negative Renal ROS  negative genitourinary   Musculoskeletal   Abdominal   Peds  Hematology negative hematology ROS (+)   Anesthesia Other Findings   Reproductive/Obstetrics (+) Pregnancy                             Anesthesia Physical Anesthesia Plan  ASA: II  Anesthesia Plan: Epidural   Post-op Pain Management:    Induction:   PONV Risk Score and Plan: 2  Airway Management Planned:   Additional Equipment:   Intra-op Plan:   Post-operative Plan:   Informed Consent: I have reviewed the patients History and Physical, chart, labs and discussed the procedure including the risks, benefits and alternatives for the proposed anesthesia with the patient or authorized representative who has indicated his/her understanding and acceptance.   Dental Advisory Given  Plan Discussed with: CRNA, Anesthesiologist and Surgeon  Anesthesia Plan Comments: (Labs checked- platelets confirmed with RN in room. Fetal heart tracing, per RN, reported to be stable enough for sitting procedure. Discussed epidural, and patient consents to the procedure:  included risk of possible headache,backache, failed block, allergic reaction, and nerve  injury. This patient was asked if she had any questions or concerns before the procedure started.)        Anesthesia Quick Evaluation

## 2018-04-14 NOTE — H&P (Signed)
OBSTETRIC ADMISSION HISTORY AND PHYSICAL  Heather Gentry is a 32 y.o. female G2P1001 with IUP at [redacted]w[redacted]d by L/23 we [place] presenting for labor. She reports +FMs, No LOF, no VB, no blurry vision, headaches or peripheral edema, and RUQ pain.  She plans on bottle feeding. She request nexplanon for birth control. She received her prenatal care at [facility]   Dating: By L/23 w [place] --->  Estimated Date of Delivery: 04/10/18  Sono:   @23w )d, renal pelvic fullness @[redacted]w[redacted]d , CWD, normal anatomy incl normal kidneys, cephalic presentation, 3305g, 16% EFW   Prenatal History/Complications:  Past Medical History: Past Medical History:  Diagnosis Date  . Medical history non-contributory     Past Surgical History: Past Surgical History:  Procedure Laterality Date  . NO PAST SURGERIES      Obstetrical History: OB History    Gravida  2   Para  1   Term  1   Preterm      AB      Living  1     SAB      TAB      Ectopic      Multiple      Live Births  1           Social History: Social History   Socioeconomic History  . Marital status: Married    Spouse name: Not on file  . Number of children: Not on file  . Years of education: Not on file  . Highest education level: Not on file  Occupational History  . Not on file  Social Needs  . Financial resource strain: Not on file  . Food insecurity:    Worry: Not on file    Inability: Not on file  . Transportation needs:    Medical: Not on file    Non-medical: Not on file  Tobacco Use  . Smoking status: Never Smoker  . Smokeless tobacco: Never Used  Substance and Sexual Activity  . Alcohol use: Not Currently  . Drug use: Not Currently  . Sexual activity: Yes  Lifestyle  . Physical activity:    Days per week: Not on file    Minutes per session: Not on file  . Stress: Not on file  Relationships  . Social connections:    Talks on phone: Not on file    Gets together: Not on file    Attends religious service: Not on file     Active member of club or organization: Not on file    Attends meetings of clubs or organizations: Not on file    Relationship status: Not on file  Other Topics Concern  . Not on file  Social History Narrative  . Not on file    Family History: Family History  Family history unknown: Yes    Allergies: No Known Allergies  Medications Prior to Admission  Medication Sig Dispense Refill Last Dose  . Prenatal Vit-Fe Fumarate-FA (MULTIVITAMIN-PRENATAL) 27-0.8 MG TABS tablet Take 1 tablet by mouth daily at 12 noon.   04/13/2018 at Unknown time     Review of Systems   All systems reviewed and negative except as stated in HPI  Blood pressure 140/70, pulse 97, temperature 98.5 F (36.9 C), temperature source Oral, resp. rate 16, height 5\' 4"  (1.626 m), weight 71.7 kg, SpO2 100 %. General appearance: alert, cooperative and no distress Lungs: clear to auscultation bilaterally Heart: regular rate and rhythm Abdomen: soft, non-tender; bowel sounds normal Pelvic: deferred Extremities: Homans sign is  negative, no sign of DVT DTR's +3 Presentation: cephalic Fetal monitoringBaseline: 150 bpm, Variability: Good {> 6 bpm), Accelerations: Reactive and Decelerations: prolonged decel after epidural Uterine activityFrequency: Every 2-3 minutes Dilation: 4.5 Effacement (%): 90 Station: -2, -1 Exam by:: S Nix RN   Prenatal labs: ABO, Rh: --/--/O POS (11/15 0848) Antibody: NEG (11/15 0848) Rubella: Immune (05/06 0000) RPR: Nonreactive (05/06 0000)  HBsAg: Positive (05/06 0000)  HIV: Non-reactive (05/06 0000)  GBS: Negative (10/21 0000)  1 hr Glucola normal Genetic screening  negative Anatomy US bilateral renal fulness, resolved on recent US  Prenatal Transfer Tool  Maternal Diabetes: No Genetic Screening: Normal Maternal Ultrasounds/Referrals: Abnormal:  Findings:   Fetal Kidney Anomalies, Other: resolved on f/u US Fetal Ultrasounds or other Referrals:  None Maternal Substance  Abuse:  No Significant Maternal Medications:  None Significant Maternal Lab Results: Lab values include: HBsAG positive  Results for orders placed or performed during the hospital encounter of 04/14/18 (from the past 24 hour(s))  CBC   Collection Time: 04/14/18  8:48 AM  Result Value Ref Range   WBC 17.1 (H) 4.0 - 10.5 K/uL   RBC 4.26 3.87 - 5.11 MIL/uL   Hemoglobin 12.4 12.0 - 15.0 g/dL   HCT 56.237.7 13.036.0 - 86.546.0 %   MCV 88.5 80.0 - 100.0 fL   MCH 29.1 26.0 - 34.0 pg   MCHC 32.9 30.0 - 36.0 g/dL   RDW 78.414.2 69.611.5 - 29.515.5 %   Platelets 163 150 - 400 K/uL   nRBC 0.0 0.0 - 0.2 %  Type and screen [facility]   Collection Time: 04/14/18  8:48 AM  Result Value Ref Range   ABO/RH(D) O POS    Antibody Screen NEG    Sample Expiration      04/17/2018 Performed at [facility], [address] [place], [place] [address]     Patient Active Problem List   Diagnosis Date Noted  . Normal labor 04/14/2018    Assessment/Plan:  Heather Gentry is a 32 y.o. G2P1001 at 5428w4d here for labor  #Labor: becoming active. 1015 4.5/90/-2, SROM #Pain: epidural #FWB: Category II with prolonged decel after epidural, responsive to phenylephrine, ephedrine, bolus, repositioning #ID:  GBS neg, HBSAg positive, will notify peds, recommend f/u for mom #MOF: bottole #MOC: nexplanon #Circ:  Need to ask  Isabell Bonafede L, MD  04/14/2018, 10:33 AM

## 2018-04-14 NOTE — Anesthesia Postprocedure Evaluation (Signed)
Anesthesia Post Note  Patient: Heather Gentry  Procedure(s) Performed: AN AD HOC LABOR EPIDURAL     Patient location during evaluation: Mother Baby Anesthesia Type: Epidural Level of consciousness: awake and alert Pain management: pain level controlled Vital Signs Assessment: post-procedure vital signs reviewed and stable Respiratory status: spontaneous breathing, nonlabored ventilation and respiratory function stable Cardiovascular status: stable Postop Assessment: no headache, no backache and epidural receding Anesthetic complications: no    Last Vitals:  Vitals:   04/14/18 1301 04/14/18 1500  BP: 97/79 121/72  Pulse: 85 (!) 107  Resp: 16   Temp: 37.5 C   SpO2:      Last Pain:  Vitals:   04/14/18 1301  TempSrc: Axillary  PainSc: 0-No pain   Pain Goal: Patients Stated Pain Goal: 0 (04/14/18 0640)               Dakoda Bassette

## 2018-04-14 NOTE — Anesthesia Pain Management Evaluation Note (Signed)
  CRNA Pain Management Visit Note  Patient: Heather Gentry, 32 y.o., female  "Hello I am a member of the anesthesia team at [facility]. We have an anesthesia team available at all times to provide care throughout the hospital, including epidural management and anesthesia for C-section. I don't know your plan for the delivery whether it a natural birth, water birth, IV sedation, nitrous supplementation, doula or epidural, but we want to meet your pain goals."   1.Was your pain managed to your expectations on prior hospitalizations?   Yes   2.What is your expectation for pain management during this hospitalization?     Epidural  3.How can we help you reach that goal?   Record the patient's initial score and the patient's pain goal.   Pain: 0  Pain Goal: 5 [facility] wants you to be able to say your pain was always managed very well.  Heather Gentry,Heather Gentry 04/14/2018

## 2018-04-14 NOTE — Anesthesia Postprocedure Evaluation (Signed)
Anesthesia Post Note  Patient: Heather Gentry  Procedure(s) Performed: AN AD HOC LABOR EPIDURAL     Patient location during evaluation: Mother Baby Anesthesia Type: Epidural Level of consciousness: awake and alert Pain management: pain level controlled Vital Signs Assessment: post-procedure vital signs reviewed and stable Respiratory status: spontaneous breathing, nonlabored ventilation and respiratory function stable Cardiovascular status: stable Postop Assessment: no headache, no backache, epidural receding, able to ambulate, adequate PO intake, no apparent nausea or vomiting and patient able to bend at knees Anesthetic complications: no    Last Vitals:  Vitals:   04/14/18 1615 04/14/18 1630  BP: 121/65 124/62  Pulse: 85 78  Resp: 16 16  Temp:  37.5 C  SpO2:      Last Pain:  Vitals:   04/14/18 1705  TempSrc:   PainSc: 0-No pain   Pain Goal: Patients Stated Pain Goal: 0 (04/14/18 0640)               Laban EmperorMalinova,Delray Reza Hristova

## 2018-04-15 LAB — RPR: RPR: NONREACTIVE

## 2018-04-15 NOTE — Progress Notes (Signed)
Post Partum Day 1 Subjective: no complaints, up ad lib, voiding, tolerating PO and + flatus  Objective: Blood pressure 109/66, pulse 71, temperature (!) 97.5 F (36.4 C), temperature source Oral, resp. rate 18, height 5\' 4"  (1.626 m), weight 71.7 kg, SpO2 98 %, unknown if currently breastfeeding.  Physical Exam:  General: alert, cooperative, appears stated age and no distress Lochia: appropriate Uterine Fundus: firm Incision: NA DVT Evaluation: No evidence of DVT seen on physical exam.  Recent Labs    04/14/18 0848  HGB 12.4  HCT 37.7    Assessment/Plan: Plan for discharge tomorrow   LOS: 1 day   Gwenevere AbbotNimeka Pami Wool 04/15/2018, 7:09 PM

## 2018-04-16 ENCOUNTER — Inpatient Hospital Stay (HOSPITAL_COMMUNITY)
Admission: RE | Admit: 2018-04-16 | Discharge: 2018-04-16 | Disposition: A | Discharge status: Home / Self Care | Payer: Medicaid Other | Source: Ambulatory Visit | Attending: Family Medicine | Admitting: Family Medicine

## 2018-04-16 DIAGNOSIS — Z3A40 40 weeks gestation of pregnancy: Secondary | ICD-10-CM

## 2018-04-16 DIAGNOSIS — O480 Post-term pregnancy: Secondary | ICD-10-CM

## 2018-04-16 NOTE — Discharge Summary (Signed)
OB Discharge Summary     Patient Name: Heather Gentry DOB: 09/02/1985 MRN: [id number]  Date of admission: 04/14/2018 Delivering MD: Aura CampsFRIEDMAN, JESSICA L   Date of discharge: 04/16/2018  Admitting diagnosis: 40WKS CTX Intrauterine pregnancy: 5726w4d     Secondary diagnosis:  Active Problems:   Normal labor   Hepatitis B affecting pregnancy  Additional problems: Spontaneous vaginal delivery, Anemia      Discharge diagnosis: Term Pregnancy Delivered and Anemia                                                                                                Post partum procedures:none  Augmentation: none  Complications: None  Hospital course:  Onset of Labor With Vaginal Delivery     32 y.o. yo [id number] at 7026w4d was admitted in Active Labor on 04/14/2018. Patient had an uncomplicated labor course as follows:  Membrane Rupture Time/Date: 10:04 AM ,04/14/2018   Intrapartum Procedures: Episiotomy: None [1]                                         Lacerations:  Periurethral [8]  Patient had a delivery of a Viable infant. 04/14/2018  Information for the patient's newborn:  Wallace KellerRomero Gentry, Boy Kristilyn [[id number]][[id number]]  Delivery Method: Vaginal, Spontaneous(Filed from Delivery Summary)    Pateint had an uncomplicated postpartum course.  She is ambulating, tolerating a regular diet, passing flatus, and urinating well. Patient is discharged home in stable condition on 04/16/18.   Physical exam  Vitals:   04/15/18 0600 04/15/18 1502 04/15/18 2358 04/16/18 0645  BP: 115/79 109/66 119/77 108/78  Pulse: 75 71 67 67  Resp: 20 18 18 20   Temp: (!) 97.5 F (36.4 C) (!) 97.5 F (36.4 C) 97.6 F (36.4 C) 97.8 F (36.6 C)  TempSrc: Oral Oral Oral Oral  SpO2:      Weight:      Height:       General: alert, cooperative and no distress Lochia: appropriate Uterine Fundus: firm Incision: N/A DVT Evaluation: No evidence of DVT seen on physical exam. No significant calf/ankle edema. Labs: Lab  Results  Component Value Date   WBC 17.1 (H) 04/14/2018   HGB 12.4 04/14/2018   HCT 37.7 04/14/2018   MCV 88.5 04/14/2018   PLT 163 04/14/2018   CMP Latest Ref Rng & Units 04/14/2018  Glucose 70 - 99 mg/dL -  BUN 6 - 23 mg/dL -  Creatinine 0.4 - 1.2 mg/dL -  Sodium 696135 - 295145 mEq/L -  Potassium 3.5 - 5.1 mEq/L -  Chloride 96 - 112 mEq/L -  CO2 19 - 32 mEq/L -  Calcium 8.4 - 10.5 mg/dL -  Total Protein 6.5 - 8.1 g/dL 6.5  Total Bilirubin 0.3 - 1.2 mg/dL 0.6  Alkaline Phos 38 - 126 U/L 143(H)  AST 15 - 41 U/L 22  ALT 0 - 44 U/L 25    Discharge instruction: per After Visit Summary and "Baby and Me Booklet".  After visit meds:  Allergies as of 04/16/2018   No Known Allergies     Medication List    TAKE these medications   multivitamin-prenatal 27-0.8 MG Tabs tablet Take 1 tablet by mouth daily at 12 noon.       Diet: routine diet  Activity: Advance as tolerated. Pelvic rest for 6 weeks.   Outpatient follow up:6 weeks Follow up Appt:No future appointments. Follow up Visit:No follow-ups on file.  Postpartum contraception: Nexplanon  Newborn Data: Live born female  Birth Weight: 6 lb 15.1 oz (3150 g) APGAR: 9, 9  Newborn Delivery   Birth date/time:  04/14/2018 14:53:00 Delivery type:  Vaginal, Spontaneous     Baby Feeding: Bottle Disposition:home with mother   04/16/2018 Sharyon Cable, CNM

## 2018-04-16 NOTE — Lactation Note (Signed)
This note was copied from a baby's chart. Lactation Consultation Note Mom resting in bed. Baby sleeping. Mom came in formula feeding and has decided to BF as well.  Asked mom if BF going well. Mom stated pain. Asked mom if could see breast.  Mom has small short shaft nipples. Positional stripes to Lt. Nipple. Comfort gels given to wear while resting. Mom needs interpreter for assessing and teaching. Will come back after mom rest w/interpreter.  Patient Name: Heather Gentry WGNFA'OToday's Date: 04/16/2018 Reason for consult: Initial assessment   Maternal Data    Feeding Feeding Type: Bottle Fed - Formula Nipple Type: Slow - flow  LATCH Score Latch: Grasps breast easily, tongue down, lips flanged, rhythmical sucking.  Audible Swallowing: A few with stimulation  Type of Nipple: Everted at rest and after stimulation  Comfort (Breast/Nipple): Filling, red/small blisters or bruises, mild/mod discomfort  Hold (Positioning): No assistance needed to correctly position infant at breast.  LATCH Score: 8  Interventions Interventions: Comfort gels  Lactation Tools Discussed/Used     Consult Status Consult Status: Follow-up Date: 04/16/18 Follow-up type: In-patient    Charyl DancerCARVER, Maleka Contino G 04/16/2018, 8:49 AM

## 2018-04-16 NOTE — Lactation Note (Signed)
This note was copied from a baby's chart. Lactation Consultation Note  Patient Name: Boy Heidemarie Goodnow FPULG'S Date: 04/16/2018 Reason for consult: Follow-up assessment  Alejandra, interpreter, present during consult. I explained to Mom the CDC's recommendation about breastfeeding & Hep B (if nipple bleeds, pump & discard milk until nipple healed). Mom was provided a hand pump & shown how to use it. The hand-out from the CDC, "How to Keep Your Breast Pump Kit Clean," was provided to Mom in [place].  Mom noted to have compression stripes bilaterally, darker on her L than her R. Specifics of an asymmetric latch shown via [employer]. I explained to Mom how a nipple should look once infant releases latch. Mom was able to self-latch baby & feel comfortable.   Mom follows feedings at the breast with formula.   Matthias Hughs [facility] 04/16/2018, 12:56 PM
